# Patient Record
Sex: Female | Born: 1935 | Race: White | Hispanic: No
Health system: Southern US, Community
[De-identification: ages and names within clinical notes are randomized; demographics above are authoritative.]

---

## 2010-12-30 ENCOUNTER — Inpatient Hospital Stay: Payer: Self-pay | Admitting: Internal Medicine

## 2011-03-13 ENCOUNTER — Inpatient Hospital Stay: Payer: Self-pay | Admitting: Internal Medicine

## 2011-04-15 ENCOUNTER — Emergency Department: Payer: Self-pay | Admitting: Emergency Medicine

## 2011-05-28 ENCOUNTER — Emergency Department: Payer: Self-pay | Admitting: Emergency Medicine

## 2011-05-29 LAB — URINALYSIS, COMPLETE
Bacteria: NONE SEEN
Bilirubin,UR: NEGATIVE
Blood: NEGATIVE
Hyaline Cast: 6
Leukocyte Esterase: NEGATIVE
Nitrite: NEGATIVE
Ph: 5 (ref 4.5–8.0)
Squamous Epithelial: NONE SEEN
WBC UR: <1 /HPF (ref 0–5)

## 2011-05-29 LAB — DRUG SCREEN, URINE
Cannabinoid 50 Ng, Ur ~~LOC~~: NEGATIVE (ref ?–50)
Cocaine Metabolite,Ur ~~LOC~~: NEGATIVE (ref ?–300)
MDMA (Ecstasy)Ur Screen: NEGATIVE (ref ?–500)
Opiate, Ur Screen: NEGATIVE (ref ?–300)
Phencyclidine (PCP) Ur S: NEGATIVE (ref ?–25)
Tricyclic, Ur Screen: NEGATIVE (ref ?–1000)

## 2011-05-29 LAB — CBC
HCT: 28.6 % — ABNORMAL LOW (ref 35.0–47.0)
MCH: 30.2 pg (ref 26.0–34.0)
MCHC: 33.4 g/dL (ref 32.0–36.0)
MCV: 91 fL (ref 80–100)
Platelet: 255 10*3/uL (ref 150–440)
RDW: 14.1 % (ref 11.5–14.5)
WBC: 3.9 10*3/uL (ref 3.6–11.0)

## 2011-05-29 LAB — COMPREHENSIVE METABOLIC PANEL
Albumin: 3.7 g/dL (ref 3.4–5.0)
BUN: 34 mg/dL — ABNORMAL HIGH (ref 7–18)
Chloride: 108 mmol/L — ABNORMAL HIGH (ref 98–107)
Co2: 24 mmol/L (ref 21–32)
EGFR (African American): 38 — ABNORMAL LOW
EGFR (Non-African Amer.): 31 — ABNORMAL LOW
SGOT(AST): 11 U/L — ABNORMAL LOW (ref 15–37)
SGPT (ALT): 10 U/L — ABNORMAL LOW
Sodium: 145 mmol/L (ref 136–145)

## 2011-05-29 LAB — ETHANOL: Ethanol: <3 mg/dL

## 2011-05-29 LAB — TSH: Thyroid Stimulating Horm: 2.97 u[IU]/mL

## 2011-07-20 ENCOUNTER — Emergency Department: Payer: Self-pay | Admitting: Emergency Medicine

## 2011-08-05 ENCOUNTER — Inpatient Hospital Stay: Payer: Self-pay | Admitting: Student

## 2011-08-05 LAB — URINALYSIS, COMPLETE
Bacteria: NONE SEEN
Bilirubin,UR: NEGATIVE
Blood: NEGATIVE
Glucose,UR: NEGATIVE mg/dL (ref 0–75)
Leukocyte Esterase: NEGATIVE
Protein: NEGATIVE
RBC,UR: 2 /HPF (ref 0–5)
Squamous Epithelial: NONE SEEN
WBC UR: 1 /HPF (ref 0–5)

## 2011-08-05 LAB — COMPREHENSIVE METABOLIC PANEL
Albumin: 3.5 g/dL (ref 3.4–5.0)
Anion Gap: 11 (ref 7–16)
Calcium, Total: 8.5 mg/dL (ref 8.5–10.1)
Chloride: 106 mmol/L (ref 98–107)
Co2: 26 mmol/L (ref 21–32)
EGFR (Non-African Amer.): 34 — ABNORMAL LOW
Glucose: 105 mg/dL — ABNORMAL HIGH (ref 65–99)
Osmolality: 290 (ref 275–301)
Potassium: 4.4 mmol/L (ref 3.5–5.1)
Sodium: 143 mmol/L (ref 136–145)

## 2011-08-05 LAB — CK TOTAL AND CKMB (NOT AT ARMC): CK, Total: 51 U/L (ref 21–215)

## 2011-08-05 LAB — CBC
HCT: 26.2 % — ABNORMAL LOW (ref 35.0–47.0)
MCH: 31.2 pg (ref 26.0–34.0)
Platelet: 335 10*3/uL (ref 150–440)
RBC: 2.82 10*6/uL — ABNORMAL LOW (ref 3.80–5.20)
WBC: 5.8 10*3/uL (ref 3.6–11.0)

## 2011-08-05 LAB — ETHANOL
Ethanol %: <0.003 % (ref 0.000–0.080)
Ethanol: <3 mg/dL

## 2011-08-06 LAB — CBC WITH DIFFERENTIAL/PLATELET
Basophil #: 0 10*3/uL (ref 0.0–0.1)
Basophil %: 0.3 %
Eosinophil %: 2.7 %
HCT: 27.2 % — ABNORMAL LOW (ref 35.0–47.0)
HGB: 9.1 g/dL — ABNORMAL LOW (ref 12.0–16.0)
Lymphocyte #: 0.6 10*3/uL — ABNORMAL LOW (ref 1.0–3.6)
MCH: 31 pg (ref 26.0–34.0)
MCHC: 33.2 g/dL (ref 32.0–36.0)
MCV: 93 fL (ref 80–100)
Monocyte #: 0.6 10*3/uL (ref 0.0–0.7)
Neutrophil #: 4.7 10*3/uL (ref 1.4–6.5)
Neutrophil %: 77.7 %
Platelet: 314 10*3/uL (ref 150–440)
RBC: 2.92 10*6/uL — ABNORMAL LOW (ref 3.80–5.20)
RDW: 15.4 % — ABNORMAL HIGH (ref 11.5–14.5)

## 2011-08-06 LAB — LIPID PANEL
Cholesterol: 187 mg/dL (ref 0–200)
HDL Cholesterol: 42 mg/dL (ref 40–60)
Ldl Cholesterol, Calc: 120 mg/dL — ABNORMAL HIGH (ref 0–100)
Triglycerides: 123 mg/dL (ref 0–200)
VLDL Cholesterol, Calc: 25 mg/dL (ref 5–40)

## 2011-08-06 LAB — IRON AND TIBC
Iron Bind.Cap.(Total): 298 ug/dL (ref 250–450)
Iron Saturation: 14 %
Unbound Iron-Bind.Cap.: 257 ug/dL

## 2011-08-06 LAB — FOLATE: Folic Acid: 20.1 ng/mL (ref 3.1–100.0)

## 2011-08-06 LAB — BASIC METABOLIC PANEL
Anion Gap: 13 (ref 7–16)
BUN: 25 mg/dL — ABNORMAL HIGH (ref 7–18)
Chloride: 109 mmol/L — ABNORMAL HIGH (ref 98–107)
Co2: 24 mmol/L (ref 21–32)
Creatinine: 1.39 mg/dL — ABNORMAL HIGH (ref 0.60–1.30)
EGFR (Non-African Amer.): 38 — ABNORMAL LOW
Glucose: 101 mg/dL — ABNORMAL HIGH (ref 65–99)
Potassium: 3.9 mmol/L (ref 3.5–5.1)

## 2011-08-06 LAB — TSH: Thyroid Stimulating Horm: 2.22 u[IU]/mL

## 2011-08-07 LAB — BASIC METABOLIC PANEL
BUN: 22 mg/dL — ABNORMAL HIGH (ref 7–18)
Calcium, Total: 8.4 mg/dL — ABNORMAL LOW (ref 8.5–10.1)
Creatinine: 1.23 mg/dL (ref 0.60–1.30)
EGFR (African American): 53 — ABNORMAL LOW
EGFR (Non-African Amer.): 43 — ABNORMAL LOW
Glucose: 84 mg/dL (ref 65–99)
Osmolality: 291 (ref 275–301)
Sodium: 145 mmol/L (ref 136–145)

## 2011-08-07 LAB — FERRITIN: Ferritin (ARMC): 59 ng/mL (ref 8–388)

## 2011-08-17 ENCOUNTER — Emergency Department: Payer: Self-pay | Admitting: Unknown Physician Specialty

## 2011-08-17 LAB — CBC WITH DIFFERENTIAL/PLATELET
Basophil #: 0 10*3/uL (ref 0.0–0.1)
Basophil %: 0.3 %
Eosinophil %: 3.1 %
HCT: 26.7 % — ABNORMAL LOW (ref 35.0–47.0)
Lymphocyte #: 0.7 10*3/uL — ABNORMAL LOW (ref 1.0–3.6)
MCH: 31.1 pg (ref 26.0–34.0)
MCHC: 33 g/dL (ref 32.0–36.0)
MCV: 94 fL (ref 80–100)
Monocyte #: 0.5 10*3/uL (ref 0.0–0.7)
Monocyte %: 9.9 %
Neutrophil #: 3.9 10*3/uL (ref 1.4–6.5)
Neutrophil %: 73 %
Platelet: 300 10*3/uL (ref 150–440)
RBC: 2.84 10*6/uL — ABNORMAL LOW (ref 3.80–5.20)

## 2011-08-17 LAB — COMPREHENSIVE METABOLIC PANEL
Albumin: 3.6 g/dL (ref 3.4–5.0)
Alkaline Phosphatase: 111 U/L (ref 50–136)
Anion Gap: 7 (ref 7–16)
BUN: 33 mg/dL — ABNORMAL HIGH (ref 7–18)
Bilirubin,Total: 0.3 mg/dL (ref 0.2–1.0)
Calcium, Total: 8.5 mg/dL (ref 8.5–10.1)
Creatinine: 1.44 mg/dL — ABNORMAL HIGH (ref 0.60–1.30)
EGFR (African American): 44 — ABNORMAL LOW
EGFR (Non-African Amer.): 36 — ABNORMAL LOW
Glucose: 97 mg/dL (ref 65–99)
Osmolality: 287 (ref 275–301)
SGOT(AST): 16 U/L (ref 15–37)
SGPT (ALT): 17 U/L
Total Protein: 6.9 g/dL (ref 6.4–8.2)

## 2011-08-17 LAB — TROPONIN I: Troponin-I: <0.02 ng/mL

## 2011-08-17 LAB — PRO B NATRIURETIC PEPTIDE: B-Type Natriuretic Peptide: 6065 pg/mL — ABNORMAL HIGH (ref 0–450)

## 2011-09-20 ENCOUNTER — Emergency Department: Payer: Self-pay | Admitting: *Deleted

## 2011-09-20 LAB — DRUG SCREEN, URINE
Amphetamines, Ur Screen: NEGATIVE (ref ?–1000)
Barbiturates, Ur Screen: NEGATIVE (ref ?–200)
Cannabinoid 50 Ng, Ur ~~LOC~~: NEGATIVE (ref ?–50)
Cocaine Metabolite,Ur ~~LOC~~: NEGATIVE (ref ?–300)
MDMA (Ecstasy)Ur Screen: NEGATIVE (ref ?–500)
Methadone, Ur Screen: NEGATIVE (ref ?–300)
Opiate, Ur Screen: NEGATIVE (ref ?–300)
Phencyclidine (PCP) Ur S: NEGATIVE (ref ?–25)
Tricyclic, Ur Screen: NEGATIVE (ref ?–1000)

## 2011-09-20 LAB — TSH: Thyroid Stimulating Horm: 2.22 u[IU]/mL

## 2011-09-20 LAB — URINALYSIS, COMPLETE
Bilirubin,UR: NEGATIVE
Glucose,UR: NEGATIVE mg/dL (ref 0–75)
Ketone: NEGATIVE
Protein: NEGATIVE
RBC,UR: <1 /HPF (ref 0–5)
Squamous Epithelial: NONE SEEN
WBC UR: <1 /HPF (ref 0–5)

## 2011-09-20 LAB — COMPREHENSIVE METABOLIC PANEL
Albumin: 3.7 g/dL (ref 3.4–5.0)
Alkaline Phosphatase: 100 U/L (ref 50–136)
BUN: 35 mg/dL — ABNORMAL HIGH (ref 7–18)
Bilirubin,Total: 0.2 mg/dL (ref 0.2–1.0)
Calcium, Total: 8.4 mg/dL — ABNORMAL LOW (ref 8.5–10.1)
Creatinine: 1.79 mg/dL — ABNORMAL HIGH (ref 0.60–1.30)
Glucose: 96 mg/dL (ref 65–99)
Osmolality: 293 (ref 275–301)
Potassium: 4.1 mmol/L (ref 3.5–5.1)
SGOT(AST): 14 U/L — ABNORMAL LOW (ref 15–37)
SGPT (ALT): 14 U/L
Sodium: 143 mmol/L (ref 136–145)
Total Protein: 6.7 g/dL (ref 6.4–8.2)

## 2011-09-20 LAB — CBC
HCT: 26 % — ABNORMAL LOW (ref 35.0–47.0)
HGB: 8.6 g/dL — ABNORMAL LOW (ref 12.0–16.0)
MCH: 31 pg (ref 26.0–34.0)
MCHC: 32.9 g/dL (ref 32.0–36.0)
MCV: 94 fL (ref 80–100)
WBC: 4.8 10*3/uL (ref 3.6–11.0)

## 2011-12-12 ENCOUNTER — Emergency Department: Payer: Self-pay | Admitting: Emergency Medicine

## 2012-01-05 ENCOUNTER — Emergency Department: Payer: Self-pay | Admitting: Emergency Medicine

## 2012-01-09 ENCOUNTER — Emergency Department: Payer: Self-pay | Admitting: Emergency Medicine

## 2012-01-17 ENCOUNTER — Emergency Department: Payer: Self-pay | Admitting: Emergency Medicine

## 2012-01-17 LAB — CBC WITH DIFFERENTIAL/PLATELET
Basophil #: 0 10*3/uL (ref 0.0–0.1)
Eosinophil #: 0.1 10*3/uL (ref 0.0–0.7)
Eosinophil %: 1.1 %
HCT: 31.2 % — ABNORMAL LOW (ref 35.0–47.0)
HGB: 10.5 g/dL — ABNORMAL LOW (ref 12.0–16.0)
Lymphocyte %: 7.2 %
MCH: 29.9 pg (ref 26.0–34.0)
MCHC: 33.6 g/dL (ref 32.0–36.0)
Monocyte #: 0.5 x10 3/mm (ref 0.2–0.9)
Neutrophil #: 7.1 10*3/uL — ABNORMAL HIGH (ref 1.4–6.5)
Neutrophil %: 85.2 %
Platelet: 233 10*3/uL (ref 150–440)
RBC: 3.51 10*6/uL — ABNORMAL LOW (ref 3.80–5.20)
RDW: 14.8 % — ABNORMAL HIGH (ref 11.5–14.5)

## 2012-01-17 LAB — BASIC METABOLIC PANEL
Anion Gap: 7 (ref 7–16)
Calcium, Total: 8.7 mg/dL (ref 8.5–10.1)
Chloride: 106 mmol/L (ref 98–107)
Co2: 27 mmol/L (ref 21–32)
Creatinine: 1.19 mg/dL (ref 0.60–1.30)
EGFR (African American): 46 — ABNORMAL LOW
Osmolality: 281 (ref 275–301)

## 2012-01-17 LAB — CK TOTAL AND CKMB (NOT AT ARMC): CK-MB: 0.8 ng/mL (ref 0.5–3.6)

## 2012-01-17 LAB — MAGNESIUM: Magnesium: 2.1 mg/dL

## 2012-02-12 ENCOUNTER — Ambulatory Visit: Payer: Self-pay | Admitting: Internal Medicine

## 2012-02-12 ENCOUNTER — Inpatient Hospital Stay: Payer: Self-pay | Admitting: Internal Medicine

## 2012-02-12 LAB — BASIC METABOLIC PANEL
Calcium, Total: 9.1 mg/dL (ref 8.5–10.1)
Chloride: 114 mmol/L — ABNORMAL HIGH (ref 98–107)
Co2: 21 mmol/L (ref 21–32)
EGFR (African American): 12 — ABNORMAL LOW
EGFR (Non-African Amer.): 10 — ABNORMAL LOW
Osmolality: 325 (ref 275–301)
Potassium: 5.5 mmol/L — ABNORMAL HIGH (ref 3.5–5.1)
Sodium: 150 mmol/L — ABNORMAL HIGH (ref 136–145)

## 2012-02-12 LAB — CBC WITH DIFFERENTIAL/PLATELET
Basophil #: 0 10*3/uL (ref 0.0–0.1)
Basophil %: 0.3 %
Eosinophil #: 0 10*3/uL (ref 0.0–0.7)
Eosinophil %: 0 %
HCT: 34.8 % — ABNORMAL LOW (ref 35.0–47.0)
HGB: 11.2 g/dL — ABNORMAL LOW (ref 12.0–16.0)
Lymphocyte #: 0.6 10*3/uL — ABNORMAL LOW (ref 1.0–3.6)
Lymphocyte %: 5.1 %
MCHC: 32.1 g/dL (ref 32.0–36.0)
MCV: 96 fL (ref 80–100)
Monocyte %: 5 %
Neutrophil #: 11 10*3/uL — ABNORMAL HIGH (ref 1.4–6.5)
RDW: 19.8 % — ABNORMAL HIGH (ref 11.5–14.5)
WBC: 12.3 10*3/uL — ABNORMAL HIGH (ref 3.6–11.0)

## 2012-02-12 LAB — URINALYSIS, COMPLETE
Bilirubin,UR: NEGATIVE
Blood: NEGATIVE
Glucose,UR: NEGATIVE mg/dL (ref 0–75)
Nitrite: NEGATIVE
Specific Gravity: 1.025 (ref 1.003–1.030)
Squamous Epithelial: <1
WBC UR: 6 /HPF (ref 0–5)

## 2012-02-13 LAB — BASIC METABOLIC PANEL
BUN: 82 mg/dL — ABNORMAL HIGH (ref 7–18)
Chloride: 122 mmol/L — ABNORMAL HIGH (ref 98–107)
Co2: 17 mmol/L — ABNORMAL LOW (ref 21–32)
Creatinine: 2.97 mg/dL — ABNORMAL HIGH (ref 0.60–1.30)
Glucose: 134 mg/dL — ABNORMAL HIGH (ref 65–99)
Potassium: 4.4 mmol/L (ref 3.5–5.1)
Sodium: 155 mmol/L — ABNORMAL HIGH (ref 136–145)

## 2012-02-14 LAB — BASIC METABOLIC PANEL
BUN: 81 mg/dL — ABNORMAL HIGH (ref 7–18)
Calcium, Total: 8.1 mg/dL — ABNORMAL LOW (ref 8.5–10.1)
Chloride: 121 mmol/L — ABNORMAL HIGH (ref 98–107)
EGFR (African American): 16 — ABNORMAL LOW
Glucose: 138 mg/dL — ABNORMAL HIGH (ref 65–99)
Osmolality: 330 (ref 275–301)
Potassium: 3.7 mmol/L (ref 3.5–5.1)

## 2012-02-19 ENCOUNTER — Ambulatory Visit: Payer: Self-pay | Admitting: Internal Medicine

## 2012-02-19 DEATH — deceased

## 2012-10-27 IMAGING — CT CT CERVICAL SPINE WITHOUT CONTRAST
1 series · 12 of 14 positions shown, 15 images · non-contrast
Comparison: none

REASON FOR EXAM: resident of [HOSPITAL] house. per previous shift,
unwitnessed fall, demented, c/o
COMMENTS:

[Series 5: axial · axial · 0.33mm/px · z∈[-252,-130]mm · 12 of 77 slices shown, 15 images]
[im 6/77  soft-tissue]
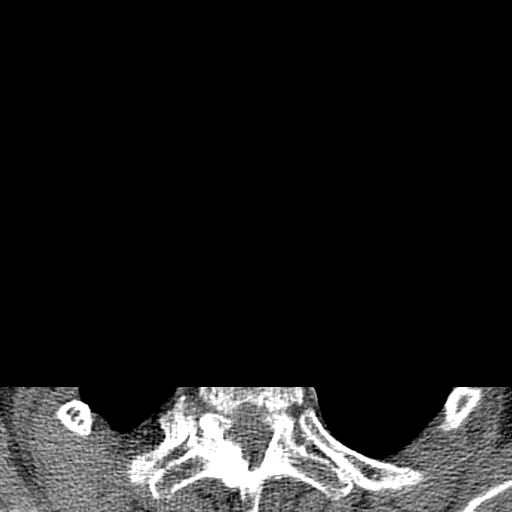
[im 6/77  bone]
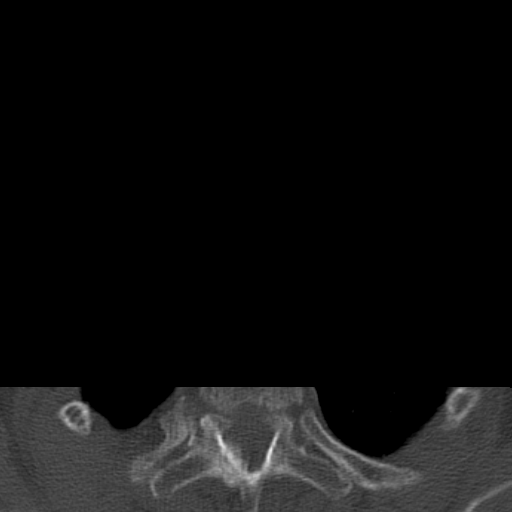
[im 12/77  bone]
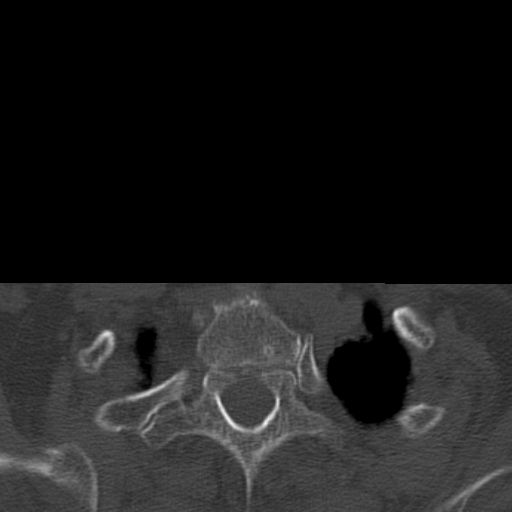
[im 18/77  bone]
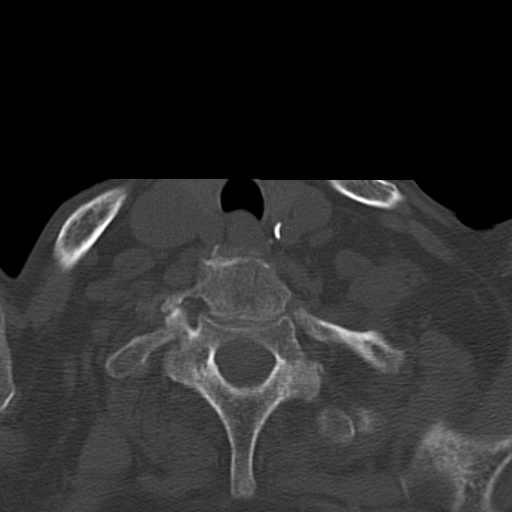
[im 24/77  bone]
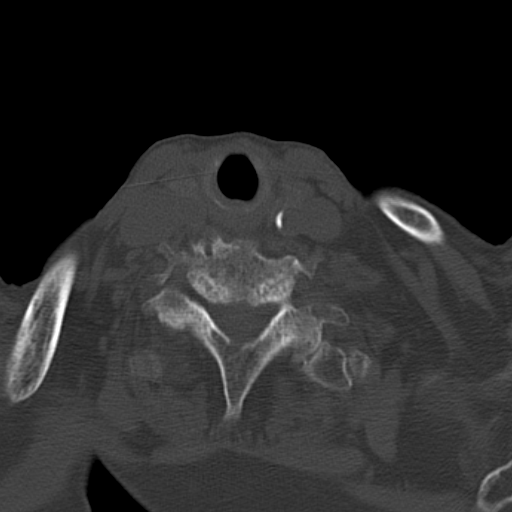
[im 30/77  soft-tissue]
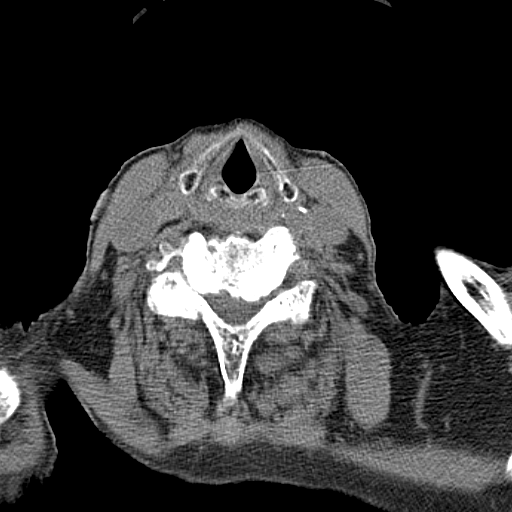
[im 30/77  bone]
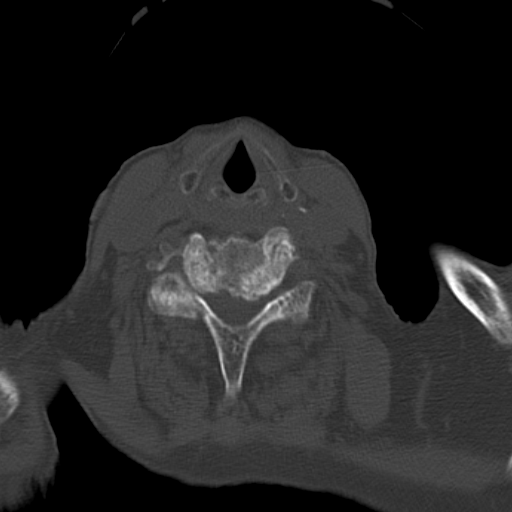
[im 36/77  bone]
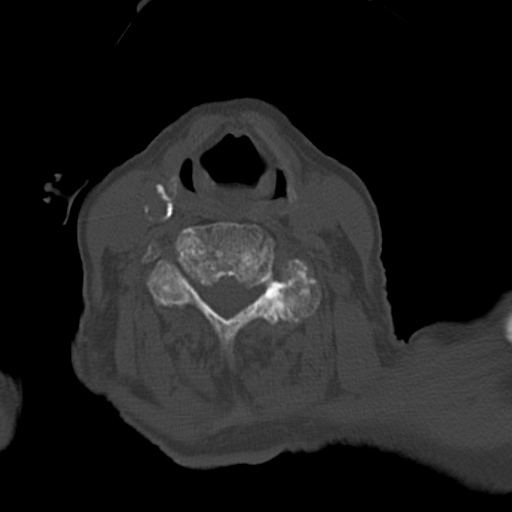
[im 41/77  bone]
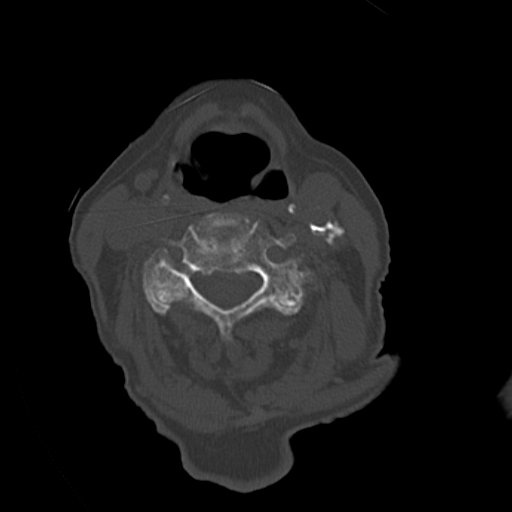
[im 47/77  bone]
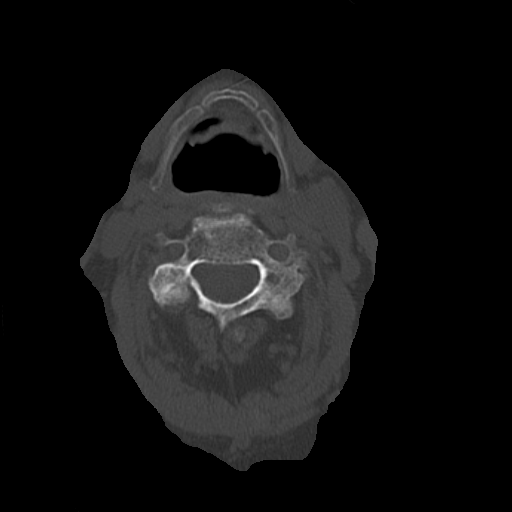
[im 53/77  soft-tissue]
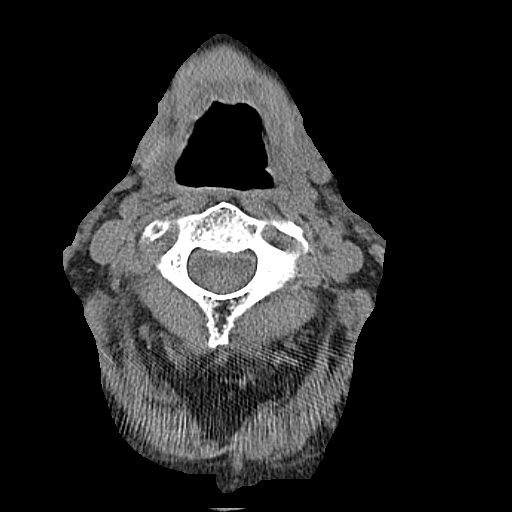
[im 53/77  bone]
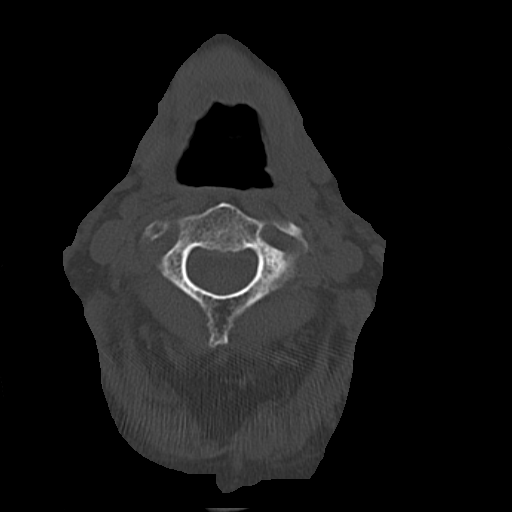
[im 59/77  bone]
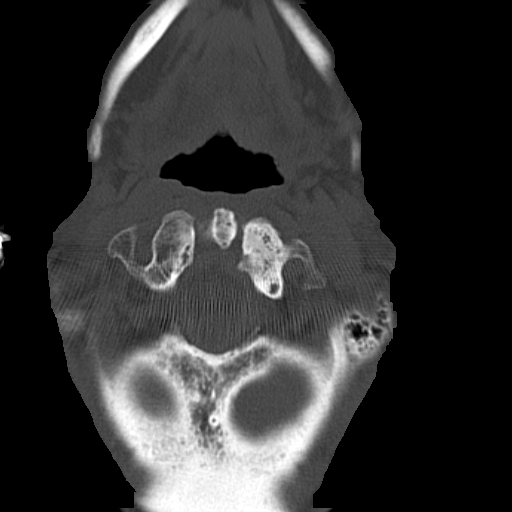
[im 65/77  bone]
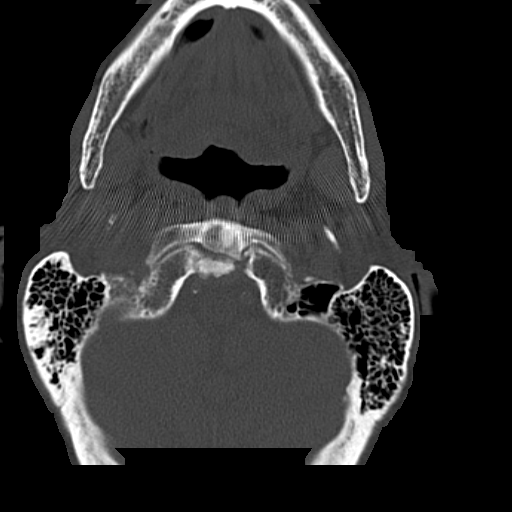
[im 71/77  bone]
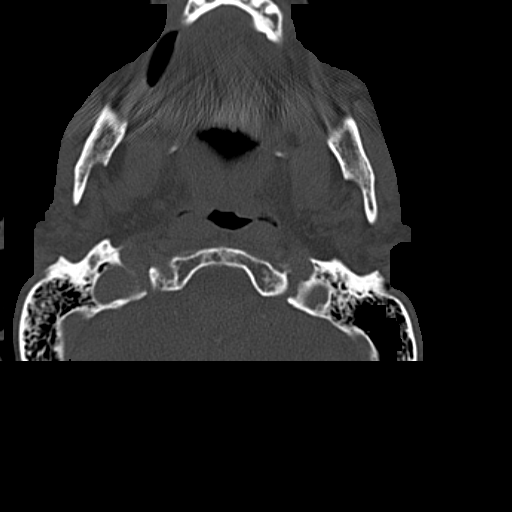

[12 of 14 positions shown; findings below may reference images not displayed]

PROCEDURE:     CT  - CT CERVICAL SPINE WO  - February 12, 2012  [DATE]

RESULT:     Sagittal, axial, and coronal images through the cervical spine
are reviewed.

The cervical vertebral bodies are preserved in height. There is stable grade
1 anterolisthesis of C7 with respect to T1. Chronic changes of the body and
base of the odontoid and body of C2 are present. There is chronic thinning
of the anterior arch of the atlas. The bony ring at each cervical level is
intact. There is considerable facet joint hypertrophy. The pulmonary apices
exhibit no evidence of a pneumothorax. There is bony encroachment upon the
spinal canal most conspicuously at the C5-C6 level due to a prominent
endplate osteophyte.
IMPRESSION: There are extensive chronic changes of the cervical spine.
There is no evidence of an acute fracture nor dislocation.

[REDACTED]

## 2012-10-27 IMAGING — CT CT HEAD WITHOUT CONTRAST
2 series · 15 of 30 positions shown, 19 images · non-contrast
Comparison: none

REASON FOR EXAM: resident of [HOSPITAL] house. per previous shift,
unwitnessed fall, demented, c/o
COMMENTS:

[Series 4: without · axial · non-contrast · 0.40mm/px · z∈[-108,+16]mm · 13 of 31 slices shown, 17 images]
[im 3/31  brain]
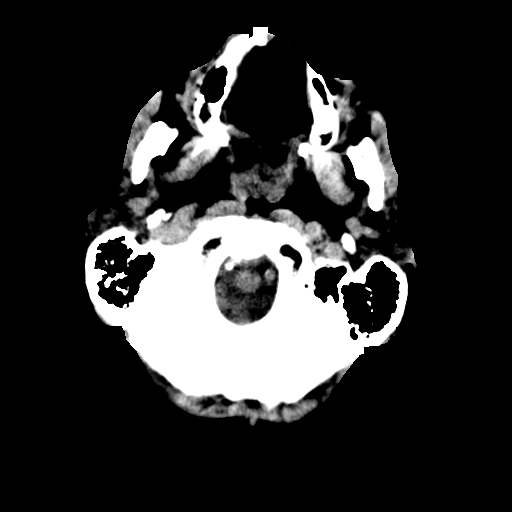
[im 3/31  bone]
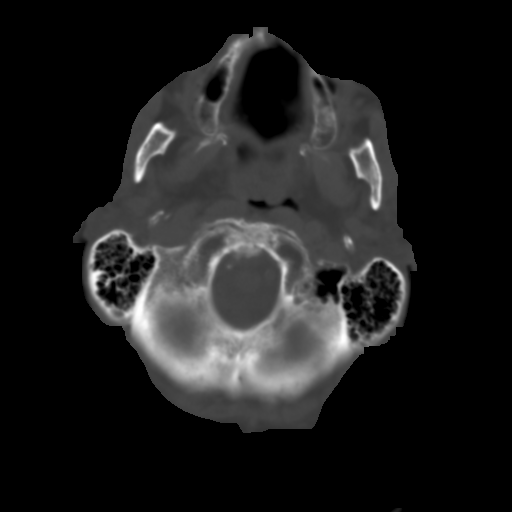
[im 5/31  brain]
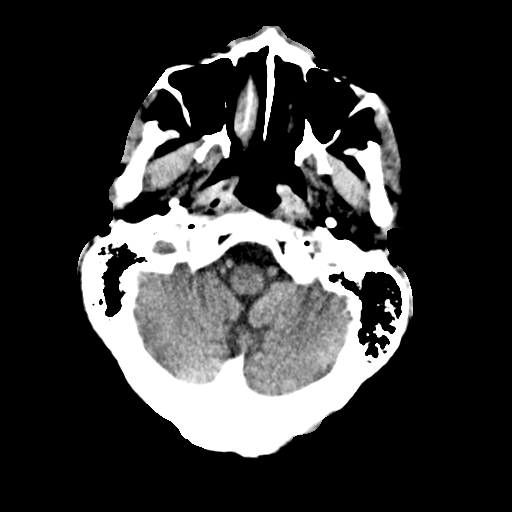
[im 7/31  brain]
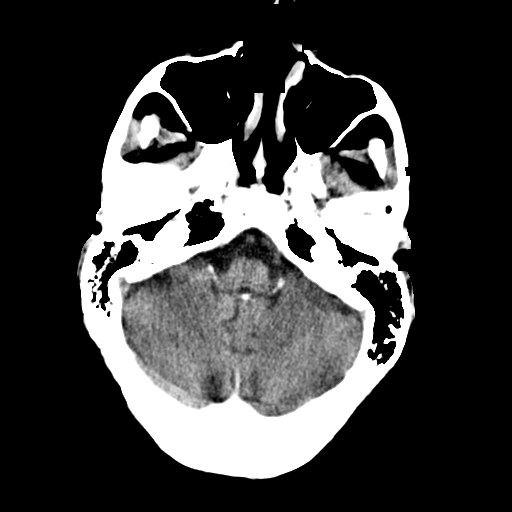
[im 9/31  brain]
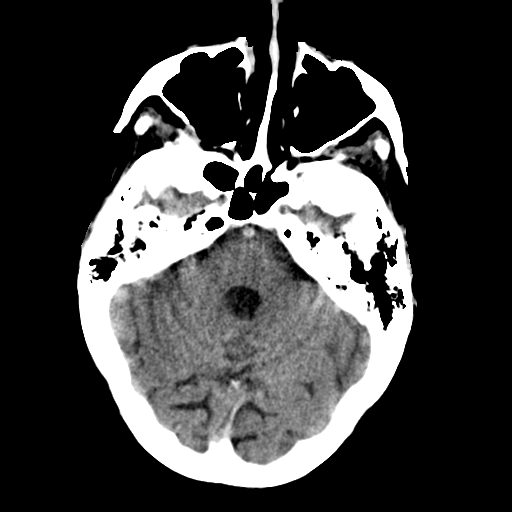
[im 11/31  brain]
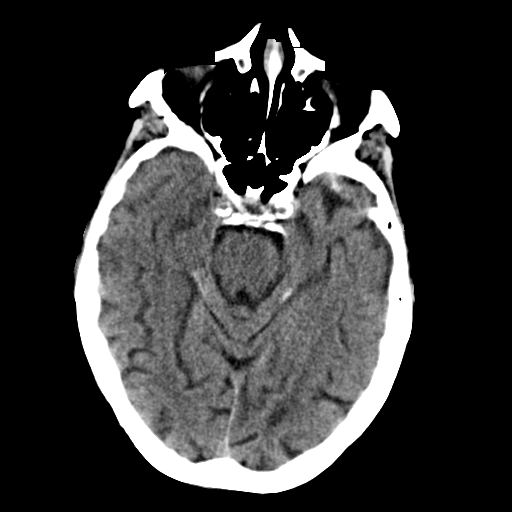
[im 11/31  bone]
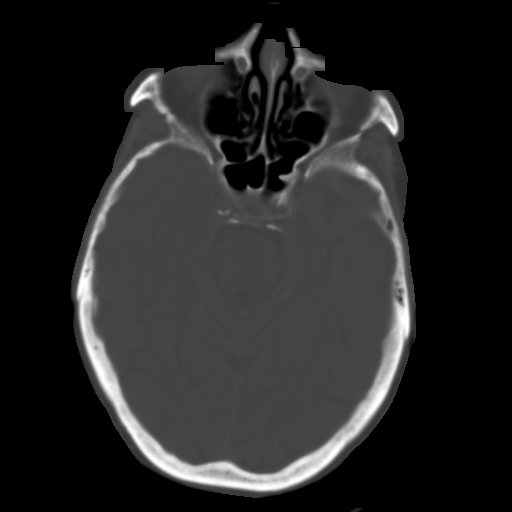
[im 13/31  brain]
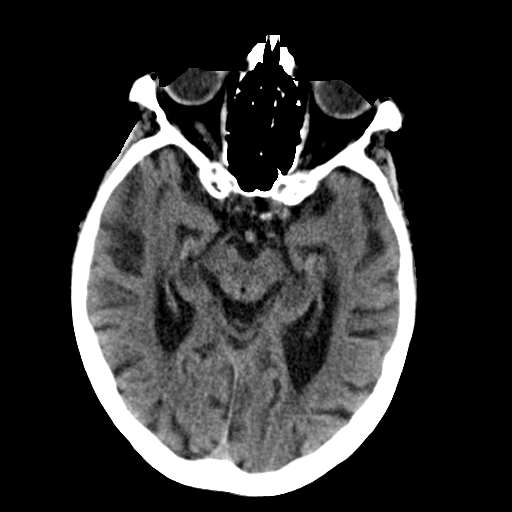
[im 16/31  brain]
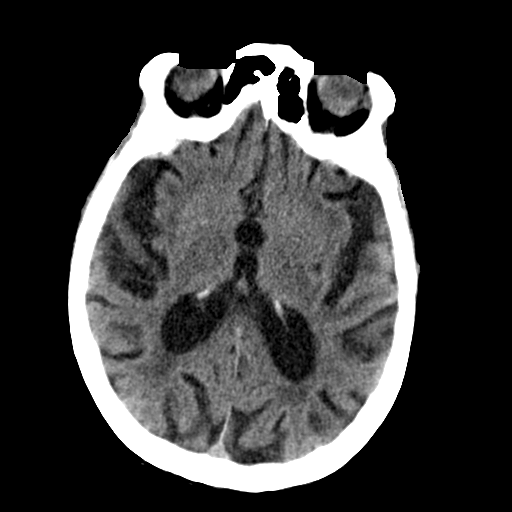
[im 18/31  brain]
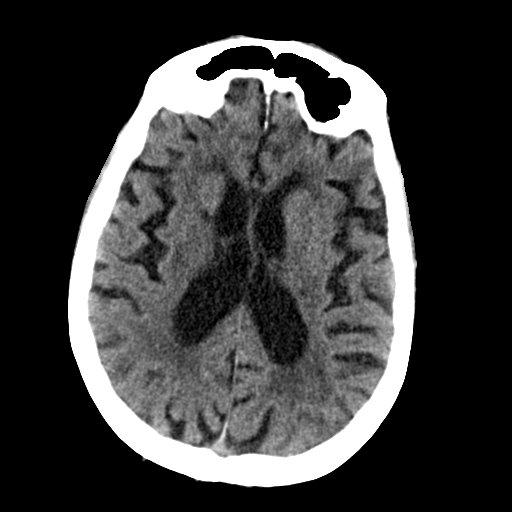
[im 20/31  brain]
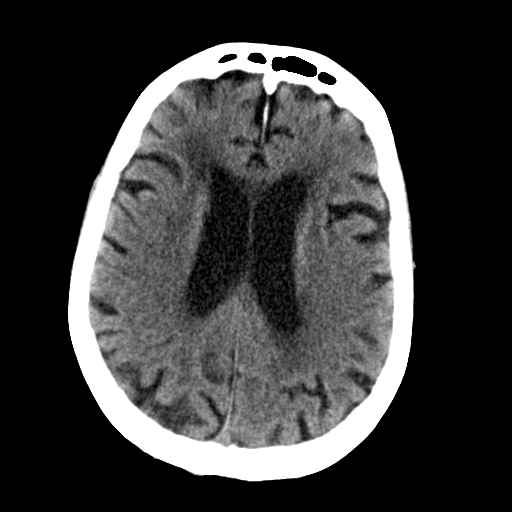
[im 20/31  bone]
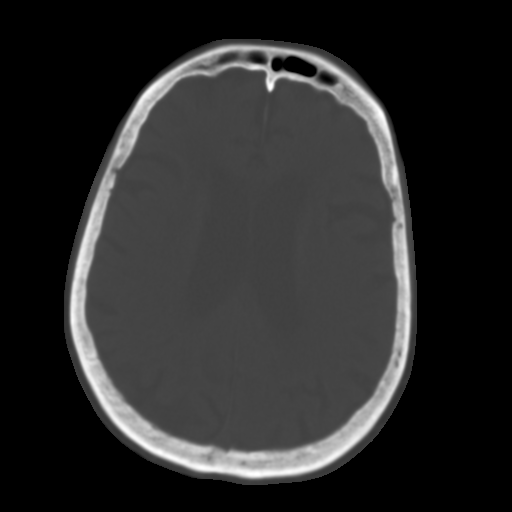
[im 22/31  brain]
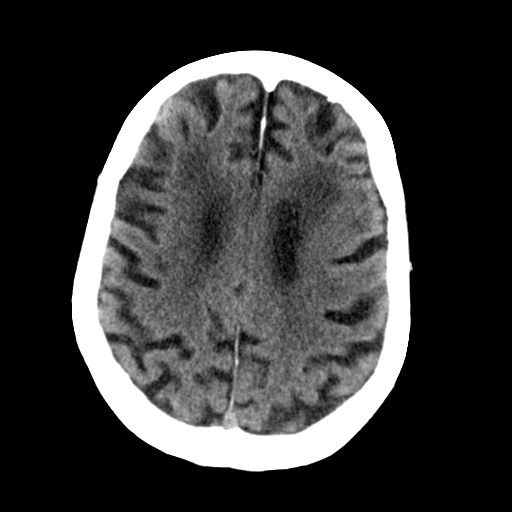
[im 24/31  brain]
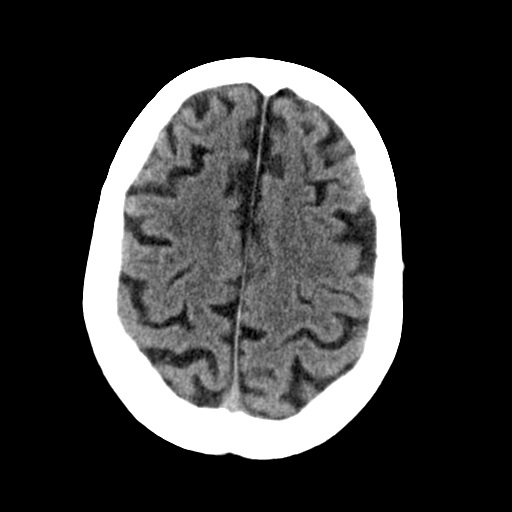
[im 26/31  brain]
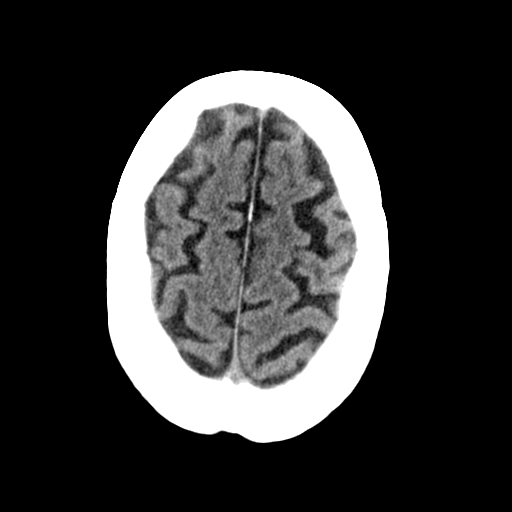
[im 28/31  brain]
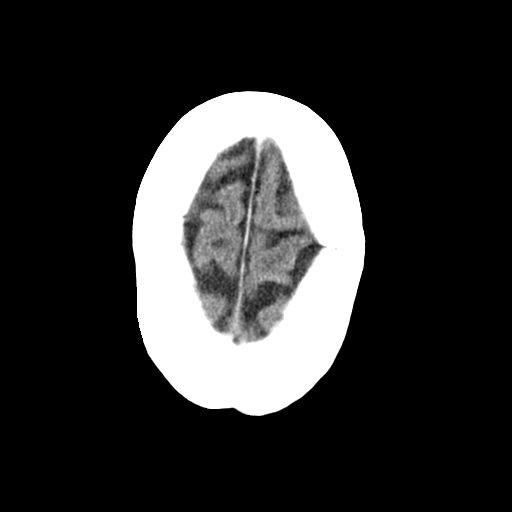
[im 28/31  bone]
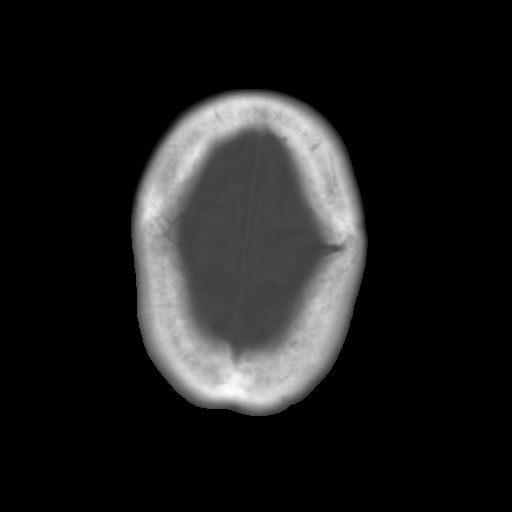

[Series 5: bone · axial · 0.40mm/px · z∈[-108,-88]mm · 2 of 31 slices shown]
[im 3/31  bone]
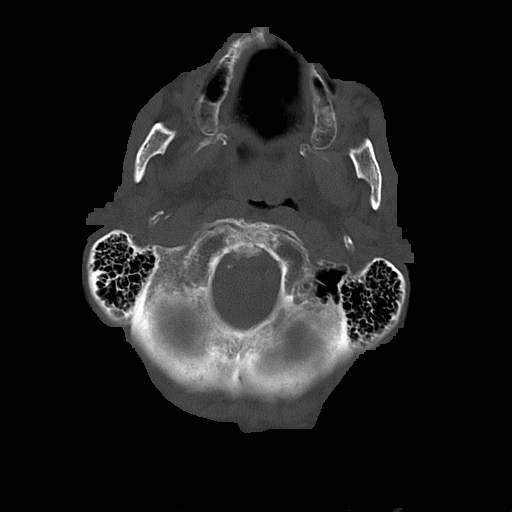
[im 7/31  bone]
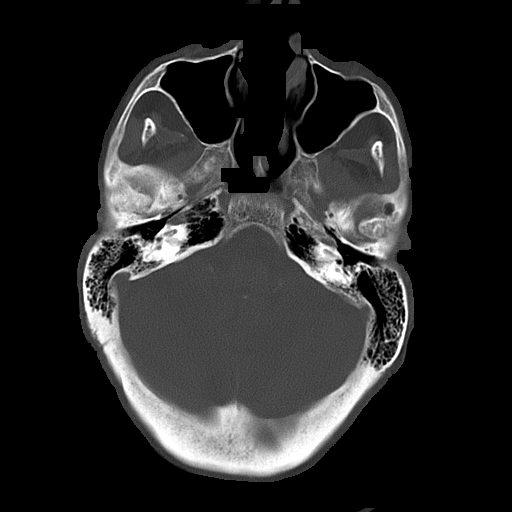

[15 of 30 positions shown; findings below may reference images not displayed]

PROCEDURE:     CT  - CT HEAD WITHOUT CONTRAST  - February 12, 2012  [DATE]

RESULT:     Axial noncontrast CT scanning was performed through the brain
with reconstructions at 5 mm intervals and slice thicknesses. Comparison is
made to the study 17 January, 2012.

There is moderate diffuse cerebral and cerebellar atrophy with compensatory
ventriculomegaly. These findings are stable. There is no evidence of an
acute intracranial hemorrhage nor of an evolving ischemic infarction. The
cerebellum and brainstem exhibit no acute abnormality. At bone window
settings the observed portions of the paranasal sinuses and mastoid air
cells are clear. There is no evidence of an acute skull fracture. Mild soft
tissue swelling over the forehead is present.
IMPRESSION: There is no acute intracranial abnormality. Extensive
chronic age-related change is present.

[REDACTED]

## 2014-09-07 NOTE — Consult Note (Signed)
Comments   I met with pt's daughter-in-law. She agrees that pt is declining and understands that she is likely approaching the end of life. We discussed transfer to the [facility] and she is in agreement with this. Ginny Ward, RN, liason for the [facility] notified. CM aware.   Electronic Signatures: Eagan Shifflett, Izora Gala (MD)  (Signed 26-Sep-13 14:39)  Authored: Palliative Care   Last Updated: 26-Sep-13 14:39 by Davidmichael Zarazua, Izora Gala (MD)

## 2014-09-07 NOTE — Discharge Summary (Signed)
PATIENT NAME:  [name], Linda MR#:  [id number] DATE OF BIRTH:  Oct 01, 1917  DATE OF ADMISSION:  02/12/2012 DATE OF DISCHARGE:  02/14/2012  HISTORY OF PRESENTATION: This is a 81 year old female with past medical history of cerebrovascular accident, weakness, nursing home resident, hypertension, chronic kidney disease, chronic anemia, and dementia. She is a resident at [facility] on Hospice care. For the last few days she has decreased oral intake and she is getting progressively weaker and dehydrated and started falling more frequently. Today she had a fall and that is why she was sent to the Emergency Room.   HOSPITAL STAY: In the Emergency Room they did workup to rule out any fracture or any injuries by CT of the head and CT of the cervical spine which were negative, just appropriate for her age. She was serially dehydrated with acute worsening of her renal failure and hyperkalemia. She was admitted to the medical floor for further management. On discussing with the family member, her son who was present at the bedside, he agreed for IV fluids for management of acute renal failure but as her status was DO NOT RESUSCITATE he just wanted to make her comfortable so we did not do any extensive work-up. She was managed on the medical floor with IV fluids. She was given Kayexalate to reverse her hyperkalemia. On presentation there were no EKG changes secondary to hyperkalemia which was at a level of more than 5. Her renal failure and her dehydration improved slowly during the hospital stay. Palliative Care consult was done at the family's wish and she was accepted at Hospice home for further management and COMFORT CARE so we transferred her to Hospice home at the time of discharge.   Other issues addressed during the hospital stay included hyperkalemia which is resolved. Her blood pressure was remaining in the normal range. Initially it was low normal but then it came up to normal like 120, 130  so we didn't start her on any hypertensive medications. She was on multiple psych and anxiety medications at presentation. We held all of them as family wished just COMFORT CARE. DVT prophylaxis was maintained with heparin sub-Q and GI prophylaxis was maintained with magnesium IV.   CONDITION ON DISCHARGE: Satisfactory.  CODE STATUS: DO NOT RESUSCITATE.   MEDICATIONS TO BE CONTINUED AFTER DISCHARGE: Roxanol and feeding as recommended by COMFORT CARE.   FOLLOW-UP RECOMMENDED: None.   Time spent in discharge process 50 min. ____________________________ Hope PigeonVaibhavkumar G. Elisabeth PigeonVachhani, MD vgv:drc D: 02/16/2012 08:44:17 ET T: 02/16/2012 15:45:09 ET JOB#: [id number]  cc: Hope PigeonVaibhavkumar G. Elisabeth PigeonVachhani, MD, <Dictator> Altamese DillingVAIBHAVKUMAR Cheney Ewart MD ELECTRONICALLY SIGNED 03/11/2012 18:07

## 2014-09-07 NOTE — H&P (Signed)
PATIENT NAME:  Linda Mckee, [name]#:  [id number] DATE OF BIRTH:  02-May-1918  DATE OF ADMISSION:  02/12/2012  CHIEF COMPLAINT ON PRESENTATION: Frequent fall and decreased oral intake.   HISTORY AND PRESENTATION: This is a 81 year old female with past medical history of cerebrovascular accident, weakness, nursing home resident, hypertension, chronic kidney disease, chronic anemia, and dementia. She is a resident of [facility] and she was on Hospice care but, as per the family member present at bedside, for the last few days she had decreased oral intake and she is getting progressively weaker and weaker and she is falling more frequently nowadays. Today morning she had a fall and they called the family in. They decided to bring her to the hospital for further work-up. On evaluation in the ER she was found having acute worsening of her renal function possibly secondary to dehydration and she was given for admission to [facility].   REVIEW OF SYSTEMS: Unable to assess as the patient is lethargic and demented so she is not able to say anything.   History obtained from the patient's son who is present at bedside and available records in the system from past admissions.   PAST MEDICAL HISTORY:  1. History of CVA. 2. History of hypertension. 3. Chronic kidney disease. 4. Chronic anemia. 5. Neurogenic bladder and dementia progressively worsening.   PAST SURGICAL HISTORY:  1. Hip replacement. 2. Knee surgery. 3. Back surgeries.   ALLERGIES: None.   FAMILY HISTORY: Her father had diabetes but there was no family history of cerebrovascular accident.   SOCIAL HISTORY: No smoking or alcohol. Resident of [facility].  MEDICATIONS SHE WAS TAKING AT NURSING HOME:  1. Vitamin C 500 mg 2 times a day.  2. Vitamin B12 1000 mcg once a day.  3. Tramadol 50 mg oral tablet every eight hours as needed.  4. Simvastatin 5 mg oral once a day. 5. Nexium 20 mg oral once a day   6. Metamucil 525 mg oral once a day.  7. Melatonin 3 mg oral tablet once a day. 8. Haloperidol 1 mg oral 3 times a day. 9. Ferrous sulfate 325 mg 3 times a day. 10. Escitalopram once a day.  11. Docusate sodium 2 times a day.  12. Amlodipine 5 mg oral once a day.  13. Acetaminophen 500 mg as needed.   PHYSICAL EXAMINATION:   VITAL SIGNS: Temperature 97.5, blood pressure 110/78, pulse rate 128, pulse oximetry 92% on room air.   GENERAL: She appears in no acute distress.   HEENT: No signs of trauma. Oral mucosa dry. Conjunctivae pale.   NECK: No JVD. Neck supple. No mass.   RESPIRATORY: Bilateral clear and equal air entry.   CARDIOVASCULAR: S1, S2 present. No murmur.   ABDOMEN: Soft, nontender. Bowel sounds present. No mass palpated.   SKIN: Dry and decreased skin turgor.   EXTREMITIES: No edema.   NEUROLOGICAL: She is lethargic. She opens eyes to call but does not follow command and speaks few words which are not relevant. She is in severe dementia stage.  LABORATORY, DIAGNOSTIC, AND RADIOLOGICAL DATA: Glucose 155, BUN 79, creatinine 3.59, sodium 150, potassium 5.5, chloride 114, CO2 21, calcium 9.1, WBC 12.3, RBC 3.63, hemoglobin 11.2, platelets 200, MCV 96. Urinalysis WBCs 6, leukocyte esterase trace.   CT of the head and CT cervical spine are done to rule out any traumatic injuries, grossly negative except age-related atrophic changes.   ASSESSMENT: This is a 81 year old female with history of  hypertension, CVA, and Alzheimer's dementia, a resident of [facility] with Hospice care, who was brought to the hospital with frequent fall and decreased oral intake found having severe dehydration and acute on chronic renal failure.   PLAN:  1. Acute on chronic renal failure due to dehydration. Will continue IV fluids and monitor the renal function tomorrow. We discussed the goal of the therapy and expectation from the family. Son was present at bedside and he is very reasonable  about the expectations. He is expecting some of the family members to come and see the patient this weekend and that is why he wants IV fluids for her to relieve her renal failure and her dehydration status. He already signed the DO NOT RESUSCITATE code sheet. We will consult the Palliative Care team for further management and comfort care for the patient.  2. Hyperkalemia. Currently there are no EKG changes so will just give Kayexalate and recheck tomorrow morning.  3. Hypertension. Blood pressure is right now running one lower side and she is dehydrated and tachycardic so will not give any hypertensive medication at this time.  4. Multiple psych and anxiety medications at nursing home. Will hold all of them at this time.  5. DVT prophylaxis with heparin. 6. GI prophylaxis with Nexium IV.  CODE STATUS: DO NOT RESUSCITATE.       TOTAL TIME SPENT: 45 minutes.   ____________________________ [name] [name], MD vgv:drc D: 02/12/2012 14:12:52 ET T: 02/12/2012 15:17:37 ET JOB#: [id number]  cc: [name]. [name], MD, <Dictator> Altamese Dilling MD ELECTRONICALLY SIGNED 03/11/2012 18:05
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
## 2014-09-12 NOTE — Consult Note (Signed)
Brief Consult Note: Diagnosis: R/O bipolar affective disorder.   Patient was seen by consultant.   Consult note dictated.   Recommend further assessment or treatment.   Orders entered.   Discussed with Attending MD.   Comments: [name] has no history of mental illness treatment. She was brought to the hospital after an episode of agitation at her apartment. There are no behavioral problems in the ER.  PLAN: 1. The patient was accepted to geropsychiatry unit for further treatment.  2. She may need to be premedicated before transfer.  Electronic Signatures: Kristine LineaPucilowska, Jolanta (MD)  (Signed 08-Jan-13 16:25)  Authored: Brief Consult Note   Last Updated: 08-Jan-13 16:25 by Kristine LineaPucilowska, Jolanta (MD)

## 2014-09-12 NOTE — Discharge Summary (Signed)
PATIENT NAME:  Linda Mckee, Linda Mckee MR#:  [id number] DATE OF BIRTH:  08/10/1917  DATE OF ADMISSION:  08/05/2011 DATE OF DISCHARGE:  08/08/2011   PRIMARY CARE PHYSICIAN: Dr. Sullivan Lone    CHIEF COMPLAINT: Decreased responsiveness, facial droop earlier.   DISCHARGE DIAGNOSES:  1. Altered mental status/facial droop possibly from transient ischemic attack, possibly from worsening dementia.  2. Anemia of chronic disease.  3. Hypertension.  4. Progressive dementia.  5. History of sinus bradycardia felt to be secondary to medication.  6. History of CVA. 7. History of TIA. 8. History of transient lightheadedness and slurred speech felt to be secondary to the bradycardia. 9. History of hypertension. 10. Chronic kidney disease with creatinine of 1.7 during previous visit to the ED. 11. History of neurogenic bladder. 12. Progressive dementia.   DISCHARGE MEDICATIONS:  1. Colace 100 mg daily.  2. Tylenol 650 mg 2 tabs every eight hours as needed.  3. Melatonin 3 mg 1 tab at bedtime.  4. Prilosec over-the-counter 20 mg daily.  5. Norvasc 10 mg daily.  6. Lisinopril 10 mg 2 times a day.  7. Risperdal 0.5 mg p.o. b.i.d.  8. Aggrenox 1 tab p.o. b.i.d.  9. Simvastatin 5 mg daily.    DO NOT TAKE:  1. Aspirin. 2. Ativan.   DIET: Low sodium, ADA diet.   ACTIVITY: As tolerated.   FOLLOW-UP: Please follow-up with your PCP within 1 to 2 weeks.   HISTORY OF PRESENT ILLNESS: The patient is a 81 year old female with history of TIA and CVA in the past without any residual deficit currently residing at [facility] who was brought in to the ED for the above complaints. Please see the full dictation from 08/05/2011 by Dr. Allena Katz for further details. She apparently was agitated in the ED and was given Ativan and was drowsy. However, no facial droop was noted in the admitting history and physical. She was admitted to hospitalist service for evaluation.  SIGNIFICANT LABS AND IMAGING: Initial  creatinine 1.51, by discharge 1.23. Ethanol level negative. LFTs within normal limits. Troponin negative x1. TSH 2.2. Initial WBC 5.8, hemoglobin 8.8, subsequent hemoglobin 9.1. Hematocrit on arrival 26.2. Platelets 335. Blood cultures from the 18th no growth to date. Urinalysis not suggesting of an infection. Vitamin B12 serum within normal limits.   Echocardiogram done on March 18th showing normal EF, no thrombus, LV systolic function is normal, right ventricular systolic function is normal.   CT of the head without contrast showed no acute intracranial process.   X-ray of the chest, one view, on March 17th no acute disease of the chest. On March 18th repeat no acute disease of the chest.   HOSPITAL COURSE: The patient was admitted and given the symptoms of possible facial droop and history of TIA and CVA, aspirin was held and the patient was started on Aggrenox. The patient did have some sundowning the first night and her Risperdal was changed to 0.5 mg p.o. b.i.d. and Ativan was completely stopped. She does not have any slurred speech or facial drooping currently. She is calm and pleasant. It is possible that she has had a TIA. It is also possible that the effects of Ativan and dementia might have caused the agitation and confusion which was evident on arrival and prior to arrival. Nonetheless, we are treating as if she did have a TIA. She also did have a mild elevation of the BUN and creatinine and was started on some gentle fluids. Currently creatinine has trended down to  1.23 from 1.5 and she does not appear to have an infection including in the urine, blood, or in the lungs. She had a 2-D echocardiogram which was pretty much unremarkable and she had sinus rhythm without any significant arrhythmias. She was evaluated by physical therapy and at this point she will be transferred back to the dementia unit with above medical changes.   The case was discussed with the patient's son who was agreeable to  this plan.   TOTAL TIME SPENT: 35 minutes.   CODE STATUS: The patient remains FULL CODE.   ____________________________ Krystal EatonShayiq Zakaree Mcclenahan, MD sa:drc D: 08/08/2011 11:52:58 ET T: 08/08/2011 12:07:29 ET JOB#: [id number]  cc: Krystal EatonShayiq Montrelle Eddings, MD, <Dictator> Richard L. Sullivan LoneGilbert, MD Krystal EatonSHAYIQ Alahna Dunne MD ELECTRONICALLY SIGNED 08/10/2011 18:42

## 2014-09-12 NOTE — H&P (Signed)
PATIENT NAME:  Linda Mckee, Linda Mckee MR#:  [id number] DATE OF BIRTH:  May 08, 1918  DATE OF ADMISSION:  08/05/2011  PRIMARY CARE PHYSICIAN: Dr. Sullivan LoneGilbert  ED REFERRING PHYSICIAN: Dr. Margarita GrizzleWoodruff   CHIEF COMPLAINT: Decrease in responsiveness as well as facial droop earlier.   HISTORY OF PRESENT ILLNESS: Patient is a 81 year old white female with previous history of transient ischemic attack/CVA without any residual side effects who currently resides in [facility] who is brought to the ED. Her son youngest son is at bedside who reports that she has had progressive decline over the past few months. She has become more progressively confused, also has had agitation episodes over the past few weeks. She also has been very weak over the past week and has not been able to walk or do much. She is able to eat and drink. Earlier today she was noted to be unresponsive by the nursing staff and when she did wake up she was noted to have right-sided face that was drooped and pulled for a few minutes. By the time patient was brought to the ED her mental status had improved. She started becoming agitated, wanted to leave the ED, she was screaming so was given Ativan. Currently she is drowsy and is able to follow commands but she is drowsy as a result of the Ativan. Otherwise, the son reports but she has not had any fevers, chills, has not complained any chest pain, shortness of breath.   REVIEW OF SYSTEMS: Not obtainable due to her mental status.   PAST MEDICAL HISTORY: 1. History of sinus bradycardia felt to be due to medication.  2. History of cerebrovascular accident.  3. History of transient ischemic attack.  4. History of transient lightheadedness and slurred speech in the past felt to be due to bradycardia.  5. Hypertension.  6. Chronic kidney disease with creatinine of 1.7 during her previous visit to the ED.  7. Chronic anemia.  8. Neurogenic bladder.  9. Dementia which has progressively gotten  worse.    ALLERGIES: None.   CURRENT MEDICATIONS:  1. Amoxicillin 500, 1 tab p.o. t.i.d. I am not sure for what. 2. Aspirin 81, 1 tab p.o. daily.  3. Ativan 0.5, 1 tab p.o. b.i.d.  4. Colace 100, 1 tab p.o. b.i.d.  5. Lisinopril 10, 1 tab p.o. b.i.d.  6. Melatonin 3 mg at bedtime.  7. Norvasc 10 daily.  8. Prilosec over-the-counter 20, 1 tab p.o. daily.  9. Risperdal 0.5 at bedtime.  10. Tylenol arthritis 650, 2 tabs q.8 p.r.n.   FAMILY HISTORY: Father with diabetes. No family history of cerebrovascular accident or hypertension.   SOCIAL HISTORY: No tobacco or alcohol use. Currently lives at [facility].   PHYSICAL EXAMINATION:  VITAL SIGNS: Temperature 98.8, pulse 102, respirations 18, blood pressure 161/82, O2 96% on room air.   GENERAL: Patient is in elderly female debilitated appearing not in any acute distress, currently sleepy.   HEENT: Head atraumatic, normocephalic. Pupils equally round, reactive to light and accommodation. Extraocular movements intact. There is no scleral icterus. No conjunctival pallor. Nasal exam shows no drainage or ulceration. Oropharynx is clear without any exudates. Ear exam externally shows no erythema or drainage.   NECK: There is no thyromegaly. No carotid bruits.   CARDIOVASCULAR: Regular rate and rhythm. No murmurs, rubs, clicks, or gallops. PMI is not displaced.   LUNGS: Clear to auscultation bilaterally without any rales, rhonchi, wheezing.   ABDOMEN: Soft, nontender, nondistended. Positive bowel sounds x4.  EXTREMITIES: No clubbing, cyanosis, edema.   SKIN: She has got some bruising in her lower extremity. There is no edema.   VASCULAR: Good DP, PT pulses.   LYMPHATICS: No lymph nodes palpable.   MUSCULOSKELETAL: There is no erythema or swelling.   PSYCHIATRIC: Currently drowsy.  NEUROLOGICAL: Patient able to squeeze my hands and move her lower extremities. No facial asymmetry noted currently.   LABORATORY,  DIAGNOSTIC AND RADIOLOGICAL DATA: Evaluation in the ED showed a CT scan of the head which showed no acute intracranial problem. Urinalysis showed nitrites negative, leukocytes negative. WBC 5.8, hemoglobin 8.8, platelet count 335, glucose 105, BUN 27, creatinine 1.51, sodium 143, potassium 4.4, chloride 106, CO2 26. CPK 51, CK-MB 0.5. Troponin less than 0.02. Alcohol level less than 0.003. Patient had a carotid Doppler in August 2012 which was negative.   ASSESSMENT AND PLAN: Patient is a 81 year old white female with progressive decline in terms of her overall health as well as her dementia is brought with episode of poorly responsiveness as well as some right facial droop which has since resolved.  1. Acute encephalopathy with fascial droop. It is unclear what the cause is. She has had a history of recurrent transient ischemic attacks in the past. She had a carotid Doppler 12/2010 which were completely normal. At this point there is no need to repeat these. She has not had echo. I will go ahead and check that to make sure there is no cardiac embolic source for her recurrent symptoms. She is on aspirin. I will go ahead and change that to Aggrenox. Will check a fasting lipid panel in the a.m. I will check a B12 level as well. Also, her altered mental status could also be related to progressive decline in her dementia as well.  2. Chronic kidney disease, appears to be stable at this point.  3. Anemia likely due to anemia of chronic disease. Will check guaiac. Will do iron, ferritin, B12 and folate level check.  4. Hypertension. Continue lisinopril and Norvasc as taking at home.  5. Dementia, likely progressive.  6. Generalized deconditioning. Will get PT evaluation. Patient may need higher level of care. I will get case manager to come evaluate the patient.   TIME SPENT: 35 minutes.   ____________________________ Lacie Scotts Allena Katz, MD shp:cms D: 08/05/2011 21:35:39 ET T: 08/06/2011 09:11:23  ET JOB#: [id number]  cc: Imberly Troxler H. Allena Katz, MD, <Dictator> Richard L. Sullivan Lone, MD Charise Carwin MD ELECTRONICALLY SIGNED 08/08/2011 21:58
